# Patient Record
Sex: Male | Born: 2000 | Hispanic: Yes | Marital: Single | Smoking: Current every day smoker
Health system: Southern US, Community
[De-identification: ages and names within clinical notes are randomized; demographics above are authoritative.]

## PROBLEM LIST (undated history)

## (undated) DIAGNOSIS — E039 Hypothyroidism, unspecified: Secondary | ICD-10-CM

## (undated) DIAGNOSIS — R159 Full incontinence of feces: Secondary | ICD-10-CM

## (undated) DIAGNOSIS — L83 Acanthosis nigricans: Secondary | ICD-10-CM

## (undated) DIAGNOSIS — N62 Hypertrophy of breast: Secondary | ICD-10-CM

## (undated) DIAGNOSIS — E785 Hyperlipidemia, unspecified: Secondary | ICD-10-CM

## (undated) DIAGNOSIS — E669 Obesity, unspecified: Secondary | ICD-10-CM

## (undated) HISTORY — DX: Hypertrophy of breast: N62

## (undated) HISTORY — DX: Hypothyroidism, unspecified: E03.9

## (undated) HISTORY — DX: Acanthosis nigricans: L83

## (undated) HISTORY — DX: Hyperlipidemia, unspecified: E78.5

## (undated) HISTORY — DX: Full incontinence of feces: R15.9

## (undated) HISTORY — DX: Obesity, unspecified: E66.9

---
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
## 2018-01-21 ENCOUNTER — Encounter (INDEPENDENT_AMBULATORY_CARE_PROVIDER_SITE_OTHER): Payer: Self-pay | Admitting: Family

## 2018-01-21 ENCOUNTER — Ambulatory Visit (INDEPENDENT_AMBULATORY_CARE_PROVIDER_SITE_OTHER): Payer: Medicaid Other | Admitting: Family

## 2018-01-21 VITALS — BP 114/78 | HR 84 | Ht 65.39 in | Wt 204.4 lb

## 2018-01-21 DIAGNOSIS — R7303 Prediabetes: Secondary | ICD-10-CM | POA: Diagnosis not present

## 2018-01-21 DIAGNOSIS — Z9119 Patient's noncompliance with other medical treatment and regimen: Secondary | ICD-10-CM | POA: Diagnosis not present

## 2018-01-21 DIAGNOSIS — E063 Autoimmune thyroiditis: Secondary | ICD-10-CM | POA: Diagnosis not present

## 2018-01-21 DIAGNOSIS — Z91199 Patient's noncompliance with other medical treatment and regimen due to unspecified reason: Secondary | ICD-10-CM

## 2018-01-21 LAB — POCT GLUCOSE (DEVICE FOR HOME USE): GLUCOSE FASTING, POC: 93 mg/dL (ref 70–99)

## 2018-01-21 LAB — POCT GLYCOSYLATED HEMOGLOBIN (HGB A1C): Hemoglobin A1C: 5.3

## 2018-01-21 NOTE — Patient Instructions (Signed)
Exercise daily  Eat healthy diet  Continue levothyroxine daily  Labs today  Follow up in 4 months.

## 2018-01-22 LAB — TSH: TSH: 114.74 m[IU]/L — AB (ref 0.50–4.30)

## 2018-01-22 LAB — T4: T4, Total: 7.1 ug/dL (ref 5.1–10.3)

## 2018-01-22 LAB — T4, FREE: Free T4: 1.2 ng/dL (ref 0.8–1.4)

## 2018-01-25 ENCOUNTER — Encounter (INDEPENDENT_AMBULATORY_CARE_PROVIDER_SITE_OTHER): Payer: Self-pay | Admitting: Family

## 2018-01-25 ENCOUNTER — Ambulatory Visit (INDEPENDENT_AMBULATORY_CARE_PROVIDER_SITE_OTHER): Payer: Self-pay | Admitting: Family

## 2018-01-25 NOTE — Progress Notes (Signed)
Subjective:  Patient Name: Nicholas Barry Date of Birth: [birth year]  MRN: [id number]  Nicholas Barry  presents to the office today for follow-up of his prediabetes, obesity, hypothyroidism, goiter, hypercholesterolemia, hypertension, and acanthosis.  HISTORY OF PRESENT ILLNESS:   Nicholas Barry is a 17 y.o. Hispanic young man.  Nicholas Barry presents today by himself.   1. Nicholas Barry was first referred to our clinic in January of 2011. At that time he had been diagnosed as being both obese and hypothyroid by his PMD about 8-12 months prior and had been started on Synthroid. I subsequently diagnosed his gynecomastia, thyroiditis, acanthosis, and pre-diabetes.    2. The patient's last [facility] visit was on 08/2017. In the interim, he has been healthy.  He is doing well overall. He continues to work with his dad 5-6 days per week. He reports that he does not have time to exercise because of how busy he is working. He tries to eat healthy. He is not drinking sugar soda. His mom cooks food for him to take to work so he does not go out to eat. He is not taking Metformin.   He admits that he rarely takes his Levothyroxine. He forgets. He denies fatigue, constipation and cold intolerance.    3. Pertinent Review of Systems:  Review of Systems  Constitutional: Negative for malaise/fatigue.  HENT: Negative.   Eyes: Negative for blurred vision and photophobia.  Respiratory: Negative for cough and wheezing.   Cardiovascular: Negative for chest pain and palpitations.  Gastrointestinal: Negative for abdominal pain, constipation, diarrhea, nausea and vomiting.  Genitourinary: Negative for frequency and urgency.  Musculoskeletal: Negative for neck pain.  Skin: Negative for itching and rash.  Neurological: Negative for dizziness, weakness and headaches.  Endo/Heme/Allergies: Negative for polydipsia.  Psychiatric/Behavioral: Negative for depression. The patient is not nervous/anxious.   All other systems reviewed and  are negative.      PAST MEDICAL, FAMILY, AND SOCIAL HISTORY  Past Medical History:  Diagnosis Date  . Acanthosis   . 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 Gynecomastia   . Hyperlipidemia   . Hypothyroid   . Obesity     Family History  Problem Relation Age of Onset  . Obesity Mother   . Diabetes Maternal Grandmother   . Hypercholesterolemia Unknown   . Thyroid disease Neg Hx      Current Outpatient Medications:  .  ketoconazole (NIZORAL) 2 % shampoo, Apply 1 application topically daily. Apply to affected areas (chest/back) and rinse off in shower., Disp: 120 mL, Rfl: 11 .  levothyroxine (SYNTHROID, LEVOTHROID) 150 MCG tablet, TAKE 1 TABLET(150 MCG) BY MOUTH DAILY BEFORE BREAKFAST, Disp: 30 tablet, Rfl: 5 .  adapalene (DIFFERIN) 0.1 % gel, Apply topically at bedtime. (Patient not taking: Reported on 01/21/2018), Disp: 45 g, Rfl: 2 .  albuterol (PROVENTIL HFA;VENTOLIN HFA) 108 (90 Base) MCG/ACT inhaler, Inhale 2-4 puffs into the lungs every 4 (four) hours as needed for wheezing or shortness of breath. (Patient not taking: Reported on 01/21/2018), Disp: 1 Inhaler, Rfl: 0 .  cetirizine (ZYRTEC) 10 MG tablet, Take 1 tablet (10 mg total) by mouth at bedtime. (Patient not taking: Reported on 06/17/2017), Disp: 30 tablet, Rfl: 1 .  clindamycin-benzoyl peroxide (BENZACLIN) gel, Apply topically 2 (two) times daily. (Patient not taking: Reported on 01/21/2018), Disp: 25 g, Rfl: 12 .  mupirocin ointment (BACTROBAN) 2 %, Apply to rash 2-3 times a day, especially if rash is draining or bleeding. (Patient not taking: Reported on 06/17/2017), Disp: 30 g, Rfl: 1  Allergies as  of 01/21/2018  . (No Known Allergies)   1. Working as a Nutritional therapist.  2. Activities: He has not been lifting weights. He walks some days       3. Smoking, alcohol, or drugs: reports that he has been smoking.  He uses smokeless tobacco. He reports that he has current or past drug history. He reports that he does not drink alcohol.  He has been  smoking marijuana, contrary to his mother's wishes.  4. Primary Care Provider: Dr. Allayne Gitelman, [facility].  REVIEW OF SYSTEMS: There are no other significant problems involving Nicholas Barry's other body systems.   Objective:  Vital Signs:  BP 114/78   Pulse 84   Ht 5' 5.39" (1.661 m)   Wt 204 lb 6.4 oz (92.7 kg)   BMI 33.61 kg/m    Ht Readings from Last 3 Encounters:  01/21/18 5' 5.39" (1.661 m) (7 %, Z= -1.46)*  09/27/17 5' 5.04" (1.652 m) (6 %, Z= -1.56)*  07/22/17 5' 5.04" (1.652 m) (6 %, Z= -1.55)*   * Growth percentiles are based on CDC (Boys, 2-20 Years) data.   Body surface area is 2.07 meters squared.  7 %ile (Z= -1.46) based on CDC (Boys, 2-20 Years) Stature-for-age data based on Stature recorded on 01/21/2018. 94 %ile (Z= 1.59) based on CDC (Boys, 2-20 Years) weight-for-age data using vitals from 01/21/2018. No head circumference on file for this encounter.   PHYSICAL EXAM:  General: Well developed, well nourished male in no acute distress.  Alert and oriented.  Head: Normocephalic, atraumatic.   Eyes:  Pupils equal and round. EOMI.  Sclera white.  No eye drainage.   Ears/Nose/Mouth/Throat: Nares patent, no nasal drainage.  Normal dentition, mucous membranes moist.  Oropharynx intact. Neck: supple, no cervical lymphadenopathy, no thyromegaly Cardiovascular: regular rate, normal S1/S2, no murmurs Respiratory: No increased work of breathing.  Lungs clear to auscultation bilaterally.  No wheezes. Abdomen: soft, nontender, nondistended. Normal bowel sounds.  No appreciable masses  Extremities: warm, well perfused, cap refill < 2 sec.   Musculoskeletal: Normal muscle mass.  Normal strength Skin: warm, dry.  No rash or lesions. Neurologic: alert and oriented, normal speech      LAB DATA:      Assessment and Plan:   ASSESSMENT:  1. Hypothyroidism: He is suppose to take 150 mcg of Levothyroxine per day. He is taking 1-2 times per week. Check labs  today.  2. Hypertriglyceride: Working on diet improvement. Restart fish oil supplement daily   3. Prediabetes: A1c continues to be in "normal" range since stopping Metformin 1 year ago.   PLAN:  1. Diagnostic: Hemoglobin a1c, glucose and TFT's today.  2. Therapeutic: 150 mcg of Synthroid per day. Fish oil supplement daily. Lifestyle. .  3. Patient education: Discussed all of the above in detail with patients. Answered questions.  4. Follow-up: 4 month.   LOS: this visit lasted >15 minutes. More then 50% of the visit was devoted to counseling.   Gretchen Short FNP-C

## 2018-02-27 ENCOUNTER — Emergency Department (HOSPITAL_COMMUNITY)
Admission: EM | Admit: 2018-02-27 | Discharge: 2018-02-27 | Disposition: A | Discharge status: Home / Self Care | Payer: Self-pay | Attending: Emergency Medicine | Admitting: Emergency Medicine

## 2018-02-27 ENCOUNTER — Encounter (HOSPITAL_COMMUNITY): Payer: Self-pay | Admitting: Emergency Medicine

## 2018-02-27 DIAGNOSIS — I10 Essential (primary) hypertension: Secondary | ICD-10-CM | POA: Insufficient documentation

## 2018-02-27 DIAGNOSIS — L0100 Impetigo, unspecified: Secondary | ICD-10-CM | POA: Insufficient documentation

## 2018-02-27 DIAGNOSIS — R21 Rash and other nonspecific skin eruption: Secondary | ICD-10-CM

## 2018-02-27 DIAGNOSIS — E039 Hypothyroidism, unspecified: Secondary | ICD-10-CM | POA: Insufficient documentation

## 2018-02-27 DIAGNOSIS — F17200 Nicotine dependence, unspecified, uncomplicated: Secondary | ICD-10-CM | POA: Insufficient documentation

## 2018-02-27 DIAGNOSIS — F1290 Cannabis use, unspecified, uncomplicated: Secondary | ICD-10-CM | POA: Insufficient documentation

## 2018-02-27 MED ORDER — MUPIROCIN CALCIUM 2 % EX CREA: 1.0000 "application " | TOPICAL_CREAM | Freq: Two times a day (BID) | CUTANEOUS | 0 refills | Status: AC

## 2018-02-27 MED ORDER — AMOXICILLIN-POT CLAVULANATE 875-125 MG PO TABS: 1.0000 | ORAL_TABLET | Freq: Two times a day (BID) | ORAL | 0 refills | Status: AC

## 2018-02-27 NOTE — Discharge Instructions (Signed)
Apply Bactroban ointment twice a day. Take oral antibiotics until all gone. Use moisturizing cream twice a day for your back such as Aquaphor. Return if worsening.

## 2018-02-27 NOTE — ED Triage Notes (Signed)
Patient complains of a red, burning rash on his bag and dryness in his nose that started yesterday. Patient denies other complaints at this time.

## 2018-02-27 NOTE — ED Provider Notes (Signed)
[facility]   CSN: [id number] Arrival date & time: 02/27/18  1453     History   Chief Complaint Chief Complaint  Patient presents with  . Rash    HPI Morton AmyBernave Win is a 17 y.o. male.  HPI Morton AmyBernave Erskin is a 17 y.o. male with hx of hyperlipidemia, thyroid disorder, presents to ED with complaint of a rash. Pt has painful rash that started 3 days ago to his nose and left face. States it is burning and draining. States his nasal passages are crusted. No injuries. Denies any nasal sprays or medications to his nose. States also has developed rash to the bilateral back yesterday. Rash is not itchy. It is burning. No fever, chills. No new soaps, lotions, medications. No new clothing. Did not take anything prior to coming in.   Past Medical History:  Diagnosis Date  . Acanthosis   . 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 Gynecomastia   . Hyperlipidemia   . Hypothyroid   . Obesity     Patient Active Problem List   Diagnosis Date Noted  . Folliculitis 03/11/2017  . Uses marijuana 11/19/2015  . Prediabetes 11/02/2014  . Essential hypertension, benign 11/02/2014  . Non compliance with medical treatment 01/19/2014  . Goiter 01/16/2013  . 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 Obesity peds (BMI >=95 percentile) 09/12/2012  . Acanthosis   . Gynecomastia   . Hyperlipidemia     History reviewed. No pertinent surgical history.      Home Medications    Prior to Admission medications   Medication Sig Start Date End Date Taking? Authorizing Provider  adapalene (DIFFERIN) 0.1 % gel Apply topically at bedtime. Patient not taking: Reported on 01/21/2018 07/22/17   Jonetta OsgoodBrown, Kirsten, MD  albuterol (PROVENTIL HFA;VENTOLIN HFA) 108 [phone](90 Base) MCG/ACT inhaler Inhale 2-4 puffs into the lungs every 4 (four) hours as needed for wheezing or shortness of breath. Patient not taking: Reported on 01/21/2018  06/17/17   Laurena SpiesBhatti, Khadijah, MD  cetirizine (ZYRTEC) 10 MG tablet Take 1 tablet (10 mg total) by mouth at bedtime. Patient not taking: Reported on 06/17/2017 12/21/16   Lowanda FosterBrewer, Mindy, NP  clindamycin-benzoyl peroxide [facility](BENZACLIN) gel Apply topically 2 (two) times daily. Patient not taking: Reported on 01/21/2018 08/25/17   Jonetta OsgoodBrown, Kirsten, MD  ketoconazole (NIZORAL) 2 % shampoo Apply 1 application topically daily. Apply to affected areas (chest/back) and rinse off in shower. 01/15/17   Ettefagh, Aron BabaKate Scott, MD  levothyroxine (SYNTHROID, LEVOTHROID) 150 MCG tablet TAKE 1 TABLET(150 MCG) BY MOUTH DAILY BEFORE BREAKFAST 06/29/17   Gretchen ShortBeasley, Spenser, NP  mupirocin ointment (BACTROBAN) 2 % Apply to rash 2-3 times a day, especially if rash is draining or bleeding. Patient not taking: Reported on 06/17/2017 03/17/17   Rockney Gheearnell, Elizabeth, MD    Family History Family History  Problem Relation Age of Onset  . Obesity Mother   . Diabetes Maternal Grandmother   . Hypercholesterolemia Unknown   . Thyroid disease Neg Hx     Social History Social History   Tobacco Use  . Smoking status: Current Every Day Smoker  . Smokeless tobacco: Current User  Substance Use Topics  . Alcohol use: No    Alcohol/week: 0.0 oz  . Drug use: Yes    Comment: MARIJUANA     Allergies   Patient has no known allergies.   Review of Systems Review of Systems  Constitutional: Negative for chills and fever.  HENT: Negative for congestion, ear  pain, mouth sores, nosebleeds, sinus pain and sore throat.   Respiratory: Negative for cough, chest tightness and shortness of breath.   Cardiovascular: Negative for chest pain, palpitations and leg swelling.  Gastrointestinal: Negative for abdominal distention, abdominal pain, diarrhea, nausea and vomiting.  Genitourinary: Negative for dysuria, frequency, hematuria and urgency.  Musculoskeletal: Negative for arthralgias, myalgias, neck pain and neck stiffness.  Skin: Positive for  rash.  Allergic/Immunologic: Negative for immunocompromised state.  Neurological: Negative for dizziness, weakness, light-headedness, numbness and headaches.     Physical Exam Updated Vital Signs BP 135/69 (BP Location: Right Arm)   Pulse 90   Temp 98.7 F (37.1 C) (Oral)   Resp 16   SpO2 100%   Physical Exam  Constitutional: He appears well-developed and well-nourished. No distress.  HENT:  Head: Normocephalic and atraumatic.  Clinic crusted lesions to bilateral nasal passages and just lateral to the left nostril.  Mild erythema at the base.  Normal oropharynx and lips with no lesions, sores, erythema.  Eyes: Conjunctivae are normal.  Neck: Neck supple.  Cardiovascular: Normal rate, regular rhythm and normal heart sounds.  Pulmonary/Chest: Effort normal. No respiratory distress. He has no wheezes. He has no rales.  Abdominal: Soft. Bowel sounds are normal. He exhibits no distension. There is no tenderness. There is no rebound.  Musculoskeletal: He exhibits no edema.  Neurological: He is alert.  Skin: Skin is warm and dry.  Dry, scaly, hyperpigmented rash to bilateral back. No vesicular lesions. No ulcerations or open wounds. No erythema.   Nursing note and vitals reviewed.    ED Treatments / Results  Labs (all labs ordered are listed, but only abnormal results are displayed) Labs Reviewed - No data to display  EKG None  Radiology No results found.  Procedures Procedures (including critical care time)  Medications Ordered in ED Medications - No data to display   Initial Impression / Assessment and Plan / ED Course  I have reviewed the triage vital signs and the nursing notes.  Pertinent labs & imaging results that were available during my care of the patient were reviewed by me and considered in my medical decision making (see chart for details).    Pt with rash to the face, erythema with honey crusted lesions to bilateral nares and just lateral to left  nostril. Most likely impetigo. No rashes to lips or oral mucosa. Also dry rash to the back, pretty much entire back with no erythema, no vesicular lesions, no drainage, no tenderness, no itching. Question pityriasis vs eczematous eruption. Will treat with moisturizing cream. Follow up with pcp. VS normal. Stable for dc home. Will start on bactroban and augmentin.   Vitals:   02/27/18 1502 02/27/18 1603 02/27/18 1718  BP: 135/69  130/74  Pulse: 90  86  Resp: 16  15  Temp: 98.7 F (37.1 C)    TempSrc: Oral    SpO2: 100% 100% 100%      Final Clinical Impressions(s) / ED Diagnoses   Final diagnoses:  Impetigo  Rash    ED Discharge Orders        Ordered    mupirocin cream (BACTROBAN) 2 %  2 times daily     02/27/18 1700    amoxicillin-clavulanate (AUGMENTIN) 875-125 MG tablet  Every 12 hours     02/27/18 1700       Jaynie Crumble, PA-C 02/27/18 Ardyth Harps, MD 02/27/18 2126

## 2018-05-23 ENCOUNTER — Ambulatory Visit (INDEPENDENT_AMBULATORY_CARE_PROVIDER_SITE_OTHER): Payer: Self-pay | Admitting: Family
